# Patient Record
Sex: Male | Born: 1985 | Race: White | Hispanic: No | Marital: Married | Smoking: Never smoker
Health system: Southern US, Community
[De-identification: ages and names within clinical notes are randomized; demographics above are authoritative.]

## PROBLEM LIST (undated history)

## (undated) DIAGNOSIS — K8020 Calculus of gallbladder without cholecystitis without obstruction: Secondary | ICD-10-CM

## (undated) HISTORY — PX: TONSILLECTOMY: SUR1361

## (undated) DEATH — deceased

---

## 2012-06-15 ENCOUNTER — Encounter (HOSPITAL_BASED_OUTPATIENT_CLINIC_OR_DEPARTMENT_OTHER): Payer: Self-pay | Admitting: *Deleted

## 2012-06-15 ENCOUNTER — Emergency Department (HOSPITAL_BASED_OUTPATIENT_CLINIC_OR_DEPARTMENT_OTHER)
Admission: EM | Admit: 2012-06-15 | Discharge: 2012-06-15 | Disposition: A | Discharge status: Home / Self Care | Attending: Emergency Medicine | Admitting: Emergency Medicine

## 2012-06-15 DIAGNOSIS — Y9389 Activity, other specified: Secondary | ICD-10-CM | POA: Insufficient documentation

## 2012-06-15 DIAGNOSIS — S61412A Laceration without foreign body of left hand, initial encounter: Secondary | ICD-10-CM

## 2012-06-15 DIAGNOSIS — S61409A Unspecified open wound of unspecified hand, initial encounter: Secondary | ICD-10-CM | POA: Insufficient documentation

## 2012-06-15 DIAGNOSIS — W230XXA Caught, crushed, jammed, or pinched between moving objects, initial encounter: Secondary | ICD-10-CM | POA: Insufficient documentation

## 2012-06-15 DIAGNOSIS — Y929 Unspecified place or not applicable: Secondary | ICD-10-CM | POA: Insufficient documentation

## 2012-06-15 NOTE — ED Provider Notes (Signed)
History     CSN: [id number]  Arrival date & time 06/15/12  1356   First MD Initiated Contact with Patient 06/15/12 1521      Chief Complaint  Patient presents with  . Extremity Laceration    (Consider location/radiation/quality/duration/timing/severity/associated sxs/prior treatment) Patient is a 27 y.o. male presenting with skin laceration. The history is provided by the patient. No language interpreter was used.  Laceration Location:  Hand Hand laceration location:  L hand Length (cm):  3 Depth:  Through dermis Quality: stellate   Bleeding: controlled   Time since incident:  1 hour Laceration mechanism:  Metal edge (smashed hand between door and dumpter while throwing door in dumpster (pt is in process of moving)) Pain details:    Quality:  Sharp   Severity:  Mild   Timing:  Constant   Progression:  Unchanged Foreign body present:  No foreign bodies Relieved by:  None tried Worsened by:  Movement Tetanus status:  Up to date   History reviewed. No pertinent past medical history.  Past Surgical History  Procedure Laterality Date  . Tonsillectomy      No family history on file.  History  Substance Use Topics  . Smoking status: Never Smoker   . Smokeless tobacco: Not on file  . Alcohol Use: Yes      Review of Systems  Musculoskeletal: Positive for arthralgias. Negative for myalgias.  Skin: Positive for wound. Negative for color change.    Allergies  Review of patient's allergies indicates no known allergies.  Home Medications   Current Outpatient Rx  Name  Route  Sig  Dispense  Refill  . Loratadine (CLARITIN PO)   Oral   Take by mouth.           BP 153/75  Pulse 70  Temp(Src) 98.6 F (37 C) (Oral)  Resp 18  SpO2 96%  Physical Exam  Nursing note and vitals reviewed. Constitutional: He appears well-developed and well-nourished. No distress.  HENT:  Head: Normocephalic and atraumatic.  Eyes: Conjunctivae are normal. No scleral icterus.    Neck: Normal range of motion.  Cardiovascular: Normal rate, regular rhythm and normal heart sounds.   Pulmonary/Chest: Effort normal and breath sounds normal.  Musculoskeletal: Normal range of motion. He exhibits tenderness. He exhibits no edema.       Left hand: He exhibits tenderness and laceration. He exhibits normal range of motion, normal capillary refill, no deformity and no swelling. Normal sensation noted. Normal strength noted.       Hands: 3cm triangular lac to 2nd MPC joint of left hand. No foreign bodies. CMS in tact. No tendon involvement.   Neurological: He is alert.  Skin: Skin is warm and dry. He is not diaphoretic.  Psychiatric: He has a normal mood and affect. His behavior is normal.    ED Course  LACERATION REPAIR Date/Time: 06/16/2012 12:54 PM Performed by: Junius Finner Authorized by: Junius Finner Consent: Verbal consent obtained. Risks and benefits: risks, benefits and alternatives were discussed Consent given by: patient Patient understanding: patient states understanding of the procedure being performed Patient identity confirmed: verbally with patient Body area: upper extremity Location details: left hand Laceration length: 3 cm Foreign bodies: no foreign bodies Tendon involvement: none Nerve involvement: none Vascular damage: no Anesthesia: local infiltration Local anesthetic: lidocaine 2% without epinephrine Anesthetic total: 4 ml Patient sedated: no Preparation: Patient was prepped and draped in the usual sterile fashion. Irrigation solution: tap water Irrigation method: tap Amount of cleaning: standard Debridement:  none Degree of undermining: none Skin closure: 4-0 Prolene Number of sutures: 3 Technique: simple Approximation: close Approximation difficulty: simple Dressing: 4x4 sterile gauze Patient tolerance: Patient tolerated the procedure well with no immediate complications.   (including critical care time)  Labs Reviewed - No data  to display No results found.   1. Laceration of left hand, initial encounter       MDM  Pt cut left hand near 2nd MCP joint while throwing door into dumpster.  Pt states he is in the process of moving.  Tetanus status UTD.  3cm lac on left hand, bleeding controlled, CMS in tact, no foreign bodies.   3 interrrupted sutures, 4-0 prolene, see procedure note  OTC: Tylenol or ibuprofen for pain.  F/u with family physician's office, urgent care, return to ED if needed for suture removal in 7-10 days. Provided pt education packet on sutured wound care. Vitals: unremarkable. Discharged in stable condition.            Junius Finner, PA-C 06/16/12 1259

## 2012-06-15 NOTE — ED Notes (Signed)
Left hand laceration. Bleeding controlled. 1" laceration over his left 2nd knuckle.

## 2012-06-16 NOTE — ED Provider Notes (Signed)
Medical screening examination/treatment/procedure(s) were performed by non-physician practitioner and as supervising physician I was immediately available for consultation/collaboration.   Jehu Mccauslin B. Bernette Mayers, MD 06/16/12 1753

## 2012-07-21 ENCOUNTER — Encounter (HOSPITAL_BASED_OUTPATIENT_CLINIC_OR_DEPARTMENT_OTHER): Payer: Self-pay | Admitting: *Deleted

## 2012-07-21 ENCOUNTER — Emergency Department (HOSPITAL_BASED_OUTPATIENT_CLINIC_OR_DEPARTMENT_OTHER)

## 2012-07-21 ENCOUNTER — Emergency Department (HOSPITAL_BASED_OUTPATIENT_CLINIC_OR_DEPARTMENT_OTHER)
Admission: EM | Admit: 2012-07-21 | Discharge: 2012-07-21 | Disposition: A | Discharge status: Home / Self Care | Attending: Emergency Medicine | Admitting: Emergency Medicine

## 2012-07-21 DIAGNOSIS — M545 Low back pain, unspecified: Secondary | ICD-10-CM | POA: Insufficient documentation

## 2012-07-21 DIAGNOSIS — R109 Unspecified abdominal pain: Secondary | ICD-10-CM | POA: Insufficient documentation

## 2012-07-21 DIAGNOSIS — N509 Disorder of male genital organs, unspecified: Secondary | ICD-10-CM | POA: Insufficient documentation

## 2012-07-21 LAB — URINALYSIS, ROUTINE W REFLEX MICROSCOPIC
Hgb urine dipstick: NEGATIVE
Leukocytes, UA: NEGATIVE
Nitrite: NEGATIVE
Protein, ur: NEGATIVE mg/dL
Urobilinogen, UA: 0.2 mg/dL (ref 0.0–1.0)

## 2012-07-21 LAB — CBC WITH DIFFERENTIAL/PLATELET
Basophils Absolute: 0 10*3/uL (ref 0.0–0.1)
Eosinophils Relative: 11 % — ABNORMAL HIGH (ref 0–5)
Lymphocytes Relative: 34 % (ref 12–46)
MCV: 86.4 fL (ref 78.0–100.0)
Platelets: 175 10*3/uL (ref 150–400)
RDW: 12.3 % (ref 11.5–15.5)
WBC: 7 10*3/uL (ref 4.0–10.5)

## 2012-07-21 LAB — COMPREHENSIVE METABOLIC PANEL
ALT: 17 U/L (ref 0–53)
AST: 22 U/L (ref 0–37)
CO2: 29 mEq/L (ref 19–32)
Calcium: 10.1 mg/dL (ref 8.4–10.5)
GFR calc non Af Amer: >90 mL/min (ref >90)
Sodium: 141 mEq/L (ref 135–145)
Total Protein: 7.5 g/dL (ref 6.0–8.3)

## 2012-07-21 MED ORDER — ONDANSETRON HCL 4 MG PO TABS: 4.0000 mg | ORAL_TABLET | Freq: Four times a day (QID) | ORAL | Status: AC

## 2012-07-21 MED ORDER — IOHEXOL 300 MG/ML  SOLN
50.0000 mL | Freq: Once | INTRAMUSCULAR | Status: AC | PRN
  Administered 2012-07-21: 50 mL via ORAL

## 2012-07-21 MED ORDER — HYDROCODONE-ACETAMINOPHEN 5-325 MG PO TABS: 2.0000 | ORAL_TABLET | ORAL | Status: DC | PRN

## 2012-07-21 MED ORDER — IOHEXOL 300 MG/ML  SOLN
100.0000 mL | Freq: Once | INTRAMUSCULAR | Status: AC | PRN
  Administered 2012-07-21: 100 mL via INTRAVENOUS

## 2012-07-21 NOTE — ED Provider Notes (Signed)
This chart was scribed for Omar Octave, MD, by Candelaria Stagers, ED Scribe. This patient was seen in room MH07/[facility]07 and the patient's care was started at 3:22 PM   History    CSN: [id number] Arrival date & time 07/21/12  1334  First MD Initiated Contact with Patient 07/21/12 1502     Chief Complaint  Patient presents with  . Groin Pain    The history is provided by the patient. No language interpreter was used.   HPI Comments: Omar Foley is a 27 y.o. male who presents to the Emergency Department complaining of constant left lower back pain that started about three days ago radiating to his left groin.  He is now experiencing testicular pain that started yesterday with no change.  He reports increased urination.  He denies hematuria, penile discharge, dysuria, fevers, or vomiting.  He reports eating and drinking normally.  He denies recent injury or trauma to his lower back or testicles.  Nothing seems to make the sx better or worse.  He has no h/o kidney stones or hernia.      History reviewed. No pertinent past medical history. Past Surgical History  Procedure Laterality Date  . Tonsillectomy     History reviewed. No pertinent family history. History  Substance Use Topics  . Smoking status: Never Smoker   . Smokeless tobacco: Not on file  . Alcohol Use: Yes    Review of Systems  All other systems reviewed and are negative.    Allergies  Review of patient's allergies indicates no known allergies.  Home Medications   Current Outpatient Rx  Name  Route  Sig  Dispense  Refill  . Loratadine (CLARITIN PO)   Oral   Take by mouth.          BP 140/80  Pulse 79  Temp(Src) 97.6 F (36.4 C) (Oral)  Resp 16  Ht 5\' 10"  (1.778 m)  Wt 200 lb (90.719 kg)  BMI 28.7 kg/m2  SpO2 100% Physical Exam  Nursing note and vitals reviewed. Constitutional: He is oriented to person, place, and time. He appears well-developed and well-nourished.  HENT:  Head: Normocephalic and  atraumatic.  Right Ear: External ear normal.  Left Ear: External ear normal.  Nose: Nose normal.  Mouth/Throat: Oropharynx is clear and moist.  Eyes: Conjunctivae and EOM are normal. Pupils are equal, round, and reactive to light.  Neck: Normal range of motion. Neck supple.  Cardiovascular: Normal rate, regular rhythm, normal heart sounds and intact distal pulses.   No murmur heard. Pulmonary/Chest: Effort normal and breath sounds normal.  Abdominal: Soft. Bowel sounds are normal.  Mild suprapubic tenderness.  Genitourinary: No penile tenderness.  No testicular tenderness.  No appreciable hernias  Musculoskeletal: Normal range of motion.  No CVA tenderness.  5/5 strength in bilateral lower extremities. Ankle plantar and dorsiflexion intact. Great toe extension intact bilaterally. +2 DP and PT pulses. +2 patellar reflexes bilaterally. Normal gait.    Neurological: He is alert and oriented to person, place, and time. He has normal reflexes.  Skin: Skin is warm and dry.  Psychiatric: He has a normal mood and affect. His behavior is normal. Thought content normal.    ED Course  Procedures   DIAGNOSTIC STUDIES: Oxygen Saturation is 100% on room air, normal by my interpretation.    COORDINATION OF CARE:  3:27 PM Discussed course of care with pt which includes blood work.  Pt denies pain medication.  Pt understands and agrees.   5:55 PM  Discussed negative results with pt.  Will discharge with pain medication.  Will provide referral for PCP.   Labs Reviewed  CBC WITH DIFFERENTIAL - Abnormal; Notable for the following:    Eosinophils Relative 11 (*)    Eosinophils Absolute 0.8 (*)    All other components within normal limits  URINALYSIS, ROUTINE W REFLEX MICROSCOPIC  COMPREHENSIVE METABOLIC PANEL  LIPASE, BLOOD   US Scrotum  07/21/2012   *RADIOLOGY REPORT*  Clinical Data:  Back pain radiating to bilateral groin  SCROTAL ULTRASOUND DOPPLER ULTRASOUND OF THE TESTICLES  Technique:  Complete ultrasound examination of the testicles, epididymis, and other scrotal structures was performed.  Color and spectral Doppler ultrasound were also utilized to evaluate blood flow to the testicles.  Comparison:  None.  Findings:  Right testis:  Normal in size and appearance, measuring 3.9 x 2.2 x 2.9 cm.  Left testis:  Normal in size and appearance, measuring 3.9 x 2.2 x 2.8 cm.  Right epididymis:  Normal in size and appearance, noting a 4 mm epididymal head cyst.  Left epididymis:  Normal in size and appearance.  Hydrocele:  Absent.  Varicocele:  Absent.  Pulsed Doppler interrogation of both testes demonstrates low resistance flow bilaterally.  IMPRESSION: Negative scrotal ultrasound.  No evidence of testicular torsion.   Original Report Authenticated By: Charline Bills, M.D.   Ct Abdomen Pelvis W Contrast  07/21/2012   *RADIOLOGY REPORT*  Clinical Data: Right groin and lower back pain.  CT ABDOMEN AND PELVIS WITH CONTRAST  Technique:  Multidetector CT imaging of the abdomen and pelvis was performed following the standard protocol during bolus administration of intravenous contrast.  Contrast: 50mL OMNIPAQUE IOHEXOL 300 MG/ML  SOLN, OMNIPAQUE IOHEXOL 300 MG/ML  SOLN  Comparison: None.  Findings: Mild diffuse low density of the liver relative to the spleen.  Normal appearing spleen, pancreas, adrenal glands, kidneys, urinary bladder and prostate gland.  Probable noncalcified gallstones in the gallbladder without gallbladder wall thickening or pericholecystic fluid.  No gastrointestinal abnormalities or enlarged lymph nodes.  No evidence of appendicitis.  The appendix is well visualized and has a normal appearance.  Clear lung bases.  Unremarkable bones.  IMPRESSION:  1.  Probable cholelithiasis. 2.  Mild diffuse hepatic steatosis.   Original Report Authenticated By: Beckie Salts, M.D.   [place] Art/ven Flow Abd Pelv Doppler  07/21/2012   *RADIOLOGY REPORT*  Clinical Data:  Back pain radiating to  bilateral groin  SCROTAL ULTRASOUND DOPPLER ULTRASOUND OF THE TESTICLES  Technique: Complete ultrasound examination of the testicles, epididymis, and other scrotal structures was performed.  Color and spectral Doppler ultrasound were also utilized to evaluate blood flow to the testicles.  Comparison:  None.  Findings:  Right testis:  Normal in size and appearance, measuring 3.9 x 2.2 x 2.9 cm.  Left testis:  Normal in size and appearance, measuring 3.9 x 2.2 x 2.8 cm.  Right epididymis:  Normal in size and appearance, noting a 4 mm epididymal head cyst.  Left epididymis:  Normal in size and appearance.  Hydrocele:  Absent.  Varicocele:  Absent.  Pulsed Doppler interrogation of both testes demonstrates low resistance flow bilaterally.  IMPRESSION: Negative scrotal ultrasound.  No evidence of testicular torsion.   Original Report Authenticated By: Charline Bills, M.D.   No diagnosis found.  MDM  L flank pain 4 days ago, now resolved, now with suprapubic pressure and testicular tenderness. No vomiting. No dysuria or hematuria.  Urinalysis negative. Patient appears comfortable. Labs unremarkable. Scrotal ultrasound  normal. No evidence of torsion. CT scan shows cholelithiasis without other pathology. Normal appendix. Patient with no right upper quadrant pain. Normal LFTs and lipase.  Consider recently passed kidney stone versus muscular skeletal pain. Stable for outpatient followup.   Date: 07/21/2012  Rate: 73  Rhythm: normal sinus rhythm  QRS Axis: normal  Intervals: normal  ST/T Wave abnormalities: normal  Conduction Disutrbances:none  Narrative Interpretation:   Old EKG Reviewed: none available   I personally performed the services described in this documentation, which was scribed in my presence. The recorded information has been reviewed and is accurate.    Omar Octave, MD 07/21/12 [phone]

## 2012-07-21 NOTE — ED Notes (Signed)
Pt c/o groin pain and lower back pain x 3 days

## 2012-07-21 NOTE — ED Notes (Signed)
Patient transported to CT 

## 2013-12-12 ENCOUNTER — Emergency Department (HOSPITAL_BASED_OUTPATIENT_CLINIC_OR_DEPARTMENT_OTHER)
Admission: EM | Admit: 2013-12-12 | Discharge: 2013-12-12 | Disposition: A | Discharge status: Home / Self Care | Payer: No Typology Code available for payment source | Attending: Emergency Medicine | Admitting: Emergency Medicine

## 2013-12-12 ENCOUNTER — Encounter (HOSPITAL_BASED_OUTPATIENT_CLINIC_OR_DEPARTMENT_OTHER): Payer: Self-pay | Admitting: *Deleted

## 2013-12-12 DIAGNOSIS — M545 Low back pain, unspecified: Secondary | ICD-10-CM

## 2013-12-12 DIAGNOSIS — K30 Functional dyspepsia: Secondary | ICD-10-CM | POA: Diagnosis not present

## 2013-12-12 DIAGNOSIS — Z79899 Other long term (current) drug therapy: Secondary | ICD-10-CM | POA: Insufficient documentation

## 2013-12-12 DIAGNOSIS — Z8719 Personal history of other diseases of the digestive system: Secondary | ICD-10-CM | POA: Diagnosis not present

## 2013-12-12 HISTORY — DX: Calculus of gallbladder without cholecystitis without obstruction: K80.20

## 2013-12-12 LAB — BASIC METABOLIC PANEL
ANION GAP: 13 (ref 5–15)
BUN: 16 mg/dL (ref 6–23)
CHLORIDE: 104 meq/L (ref 96–112)
CO2: 26 meq/L (ref 19–32)
CREATININE: 1.1 mg/dL (ref 0.50–1.35)
Calcium: 9.8 mg/dL (ref 8.4–10.5)
GFR calc non Af Amer: >90 mL/min (ref >90)
Glucose, Bld: 106 mg/dL — ABNORMAL HIGH (ref 70–99)
POTASSIUM: 4.2 meq/L (ref 3.7–5.3)
SODIUM: 143 meq/L (ref 137–147)

## 2013-12-12 LAB — CBC WITH DIFFERENTIAL/PLATELET
BASOS ABS: 0.1 10*3/uL (ref 0.0–0.1)
BASOS PCT: 1 % (ref 0–1)
Eosinophils Absolute: 0.8 10*3/uL — ABNORMAL HIGH (ref 0.0–0.7)
Eosinophils Relative: 8 % — ABNORMAL HIGH (ref 0–5)
HCT: 44.8 % (ref 39.0–52.0)
Hemoglobin: 15.6 g/dL (ref 13.0–17.0)
LYMPHS PCT: 37 % (ref 12–46)
Lymphs Abs: 3.5 10*3/uL (ref 0.7–4.0)
MCH: 30.5 pg (ref 26.0–34.0)
MCHC: 34.8 g/dL (ref 30.0–36.0)
MCV: 87.7 fL (ref 78.0–100.0)
Monocytes Absolute: 0.7 10*3/uL (ref 0.1–1.0)
Monocytes Relative: 7 % (ref 3–12)
NEUTROS ABS: 4.4 10*3/uL (ref 1.7–7.7)
NEUTROS PCT: 47 % (ref 43–77)
PLATELETS: 232 10*3/uL (ref 150–400)
RBC: 5.11 MIL/uL (ref 4.22–5.81)
RDW: 12.5 % (ref 11.5–15.5)
WBC: 9.4 10*3/uL (ref 4.0–10.5)

## 2013-12-12 LAB — URINALYSIS, ROUTINE W REFLEX MICROSCOPIC
BILIRUBIN URINE: NEGATIVE
Glucose, UA: NEGATIVE mg/dL
Hgb urine dipstick: NEGATIVE
Ketones, ur: 15 mg/dL — AB
LEUKOCYTES UA: NEGATIVE
NITRITE: NEGATIVE
Protein, ur: NEGATIVE mg/dL
SPECIFIC GRAVITY, URINE: 1.017 (ref 1.005–1.030)
UROBILINOGEN UA: 0.2 mg/dL (ref 0.0–1.0)
pH: 6.5 (ref 5.0–8.0)

## 2013-12-12 MED ORDER — RANITIDINE HCL 150 MG PO CAPS: 150.0000 mg | ORAL_CAPSULE | Freq: Every day | ORAL | Status: AC

## 2013-12-12 MED ORDER — HYDROCODONE-ACETAMINOPHEN 5-325 MG PO TABS: 2.0000 | ORAL_TABLET | ORAL | Status: AC | PRN

## 2013-12-12 MED ORDER — OMEPRAZOLE 20 MG PO CPDR: 20.0000 mg | DELAYED_RELEASE_CAPSULE | Freq: Every day | ORAL | Status: AC

## 2013-12-12 MED ORDER — METHOCARBAMOL 500 MG PO TABS
500.0000 mg | ORAL_TABLET | Freq: Two times a day (BID) | ORAL | Status: AC
Start: 2013-12-12 — End: ?

## 2013-12-12 NOTE — ED Provider Notes (Signed)
CSN: [id number]     Arrival date & time 12/12/13  1937 History   First MD Initiated Contact with Patient 12/12/13 2025     Chief Complaint  Patient presents with  . Back Pain     (Consider location/radiation/quality/duration/timing/severity/associated sxs/prior Treatment) HPI   28 year old male with history of gallstones who presents complaining of low back pain. Patient reports for the past 3 weeks he has been burping more than usual. For the past 2 weeks he has had persistent low back pain in which she described as a sharp and aching sensation, worsening with positional change and today the pain radiates down to his left groin. Pain is minimally improved with over-the-counter medication and with heating pad. He denies any specific injury or any strenuous activities. He denies any fever, chills, headache, chest pain, shortness of breath, productive cough, abdominal pain, dysuria, hematuria, penile discharge, testicular tenderness, scrotal swelling, or rash. He is a nonsmoker. He denies any discomfort with eating. He has had similar episode last year which he was evaluated in the ER but no specific finding were noted except for evidence of gallstone.  Past Medical History  Diagnosis Date  . Gallstones    Past Surgical History  Procedure Laterality Date  . Tonsillectomy     No family history on file. History  Substance Use Topics  . Smoking status: Never Smoker   . Smokeless tobacco: Not on file  . Alcohol Use: Yes    Review of Systems  All other systems reviewed and are negative.     Allergies  Review of patient's allergies indicates no known allergies.  Home Medications   Prior to Admission medications   Medication Sig Start Date End Date Taking? Authorizing Provider  Loratadine (CLARITIN PO) Take by mouth.   Yes Historical Provider, MD  HYDROcodone-acetaminophen (NORCO/VICODIN) 5-325 MG per tablet Take 2 tablets by mouth every 4 (four) hours as needed for pain. 07/21/12    Glynn OctaveStephen Rancour, MD  ondansetron (ZOFRAN) 4 MG tablet Take 1 tablet (4 mg total) by mouth every 6 (six) hours. 07/21/12   Glynn OctaveStephen Rancour, MD   BP 156/101 mmHg  Pulse 85  Temp(Src) 97.9 F (36.6 C) (Oral)  Resp 18  Ht 5\' 10"  (1.778 m)  Wt 220 lb (99.791 kg)  BMI 31.57 kg/m2  SpO2 95% Physical Exam  Constitutional: He appears well-developed and well-nourished. No distress.  HENT:  Head: Atraumatic.  Eyes: Conjunctivae are normal.  Neck: Normal range of motion. Neck supple.  Cardiovascular: Normal rate and regular rhythm.   Pulmonary/Chest: Effort normal and breath sounds normal.  Abdominal: Soft. There is no tenderness.  No inguinal hernia noted  Genitourinary:  Mild left CVA tenderness to percussion  Penis with normal shaft, the size, nontender to palpation, no testicular tenderness or scrotal swelling. Testicle with normal lies  Musculoskeletal:  No significant midline spine tenderness crepitus step-off  Neurological: He is alert.  Patellar deep tendon reflex 2+ bilaterally, no foot drop. 55 trace to all 4 extremities with intact distal pulse and normal sensation.  Skin: No rash noted.  Psychiatric: He has a normal mood and affect.    ED Course  Procedures (including critical care time)  9:10 PM Patient here with persistent low back pain. Pain is reproducible on exam. Patient is afebrile with stable normal vital sign. Workup initiated. Pain medication offered, patient declined.  10:27 PM Labs are unremarkable, UA without evidence of urinary tract infection. Suspect musculoskeletal. Patient is afebrile with stable normal vital sign. Patient  is mildly hypertensive but does not have any significant cardiac history. Patient report having recurrent indigestion and has been burping frequently for the past several weeks. I will provide GI specialist referral, and we'll prescribe a PPI and H2 blocker for some hepatic treatment. At this time he has no evidence to suggest active  gallbladder etiology. Strict return precautions discussed.  Labs Review Labs Reviewed  CBC WITH DIFFERENTIAL - Abnormal; Notable for the following:    Eosinophils Relative 8 (*)    Eosinophils Absolute 0.8 (*)    All other components within normal limits  BASIC METABOLIC PANEL - Abnormal; Notable for the following:    Glucose, Bld 106 (*)    All other components within normal limits  URINALYSIS, ROUTINE W REFLEX MICROSCOPIC - Abnormal; Notable for the following:    Ketones, ur 15 (*)    All other components within normal limits    Imaging Review No results found.   EKG Interpretation None      MDM   Final diagnoses:  Left-sided low back pain without sciatica  Indigestion    BP 156/101 mmHg  Pulse 85  Temp(Src) 97.9 F (36.6 C) (Oral)  Resp 18  Ht 5\' 10"  (1.778 m)  Wt 220 lb (99.791 kg)  BMI 31.57 kg/m2  SpO2 95%     Fayrene HelperBowie Demarion Pondexter, PA-C 12/12/13 2228  Vanetta MuldersScott Zackowski, MD 12/13/13 2007

## 2013-12-12 NOTE — Discharge Instructions (Signed)
Back Pain, Adult Low back pain is very common. About 1 in 5 people have back pain.The cause of low back pain is rarely dangerous. The pain often gets better over time.About half of people with a sudden onset of back pain feel better in just 2 weeks. About 8 in 10 people feel better by 6 weeks.  CAUSES Some common causes of back pain include:  Strain of the muscles or ligaments supporting the spine.  Wear and tear (degeneration) of the spinal discs.  Arthritis.  Direct injury to the back. DIAGNOSIS Most of the time, the direct cause of low back pain is not known.However, back pain can be treated effectively even when the exact cause of the pain is unknown.Answering your caregiver's questions about your overall health and symptoms is one of the most accurate ways to make sure the cause of your pain is not dangerous. If your caregiver needs more information, he or she may order lab work or imaging tests (X-rays or MRIs).However, even if imaging tests show changes in your back, this usually does not require surgery. HOME CARE INSTRUCTIONS For many people, back pain returns.Since low back pain is rarely dangerous, it is often a condition that people can learn to [facility] their own.   Remain active. It is stressful on the back to sit or stand in one place. Do not sit, drive, or stand in one place for more than 30 minutes at a time. Take short walks on level surfaces as soon as pain allows.Try to increase the length of time you walk each day.  Do not stay in bed.Resting more than 1 or 2 days can delay your recovery.  Do not avoid exercise or work.Your body is made to move.It is not dangerous to be active, even though your back may hurt.Your back will likely heal faster if you return to being active before your pain is gone.  Pay attention to your body when you bend and lift. Many people have less discomfortwhen lifting if they bend their knees, keep the load close to their bodies,and  avoid twisting. Often, the most comfortable positions are those that put less stress on your recovering back.  Find a comfortable position to sleep. Use a firm mattress and lie on your side with your knees slightly bent. If you lie on your back, put a pillow under your knees.  Only take over-the-counter or prescription medicines as directed by your caregiver. Over-the-counter medicines to reduce pain and inflammation are often the most helpful.Your caregiver may prescribe muscle relaxant drugs.These medicines help dull your pain so you can more quickly return to your normal activities and healthy exercise.  Put ice on the injured area.  Put ice in a plastic bag.  Place a towel between your skin and the bag.  Leave the ice on for 15-20 minutes, 03-04 times a day for the first 2 to 3 days. After that, ice and heat may be alternated to reduce pain and spasms.  Ask your caregiver about trying back exercises and gentle massage. This may be of some benefit.  Avoid feeling anxious or stressed.Stress increases muscle tension and can worsen back pain.It is important to recognize when you are anxious or stressed and learn ways to manage it.Exercise is a great option. SEEK MEDICAL CARE IF:  You have pain that is not relieved with rest or medicine.  You have pain that does not improve in 1 week.  You have new symptoms.  You are generally not feeling well. SEEK  IMMEDIATE MEDICAL CARE IF:   You have pain that radiates from your back into your legs.  You develop new bowel or bladder control problems.  You have unusual weakness or numbness in your arms or legs.  You develop nausea or vomiting.  You develop abdominal pain.  You feel faint. Document Released: 01/07/2005 Document Revised: 07/09/2011 Document Reviewed: 05/11/2013 [facility] Patient Information 2015 [place], [place]. This information is not intended to replace advice given to you by your health care provider. Make sure you  discuss any questions you have with your health care provider.  Indigestion  Indigestion is discomfort in the upper belly (abdomen). HOME CARE  Avoid foods and drinks that make your problems worse. You may want to avoid:  Caffeine and alcohol.  Chocolate.  Peppermint.  Garlic and onions.  Spicy foods.  Citrus fruits, such as oranges, lemons, or limes.  Tomato-based foods such as sauce, chili, salsa, and pizza.  Fried and fatty foods.  Avoid eating for 3 hours before your bedtime.  Eat small meals instead of large meals more often.  Stop smoking if you smoke.  Maintain a healthy weight.  Wear loose-fitting clothing. Do not wear anything tight around your waist.  Raise the head of your bed 4 to 8 inches with wood blocks.  Only take medicines as told by your doctor.  Do not take aspirin or ibuprofen. GET HELP RIGHT AWAY IF:  You are not better after 2 days.  You have chest pain that goes into your neck, arms, back, jaw, or upper belly.  You have trouble swallowing.  You keep throwing up (vomiting).  You have black or bloody poop (stool).  You have a fever.  You have trouble breathing, you feel dizzy, or you pass out (faint).  You are sweating a lot.  You have severe belly pain.  You lose weight without trying. MAKE SURE YOU:  Understand these instructions.  Will watch your condition.  Will get help right away if you are not doing well or get worse. Document Released: 02/09/2010 Document Revised: 04/01/2011 Document Reviewed: 08/22/2010 [facility] CenterExitCare Patient Information 2015 [place], [place]. This information is not intended to replace advice given to you by your health care provider. Make sure you discuss any questions you have with your health care provider.

## 2013-12-12 NOTE — ED Notes (Signed)
Pt reports two weeks of lower back pain that now radiates down through his left groin area today.

## 2014-10-07 IMAGING — US US SCROTUM
1 series · 14 of 25 positions shown · non-contrast
Comparison: None.

CLINICAL DATA: Back pain radiating to bilateral groin

SCROTAL ULTRASOUND
DOPPLER ULTRASOUND OF THE TESTICLES
TECHNIQUE: Complete ultrasound examination of the testicles,
epididymis, and other scrotal structures was performed.  Color and
spectral Doppler ultrasound were also utilized to evaluate blood
flow to the testicles.

[Series 1: us scrotum · 0.08mm/px · 14 of 56 slices shown]
[im 1/56]
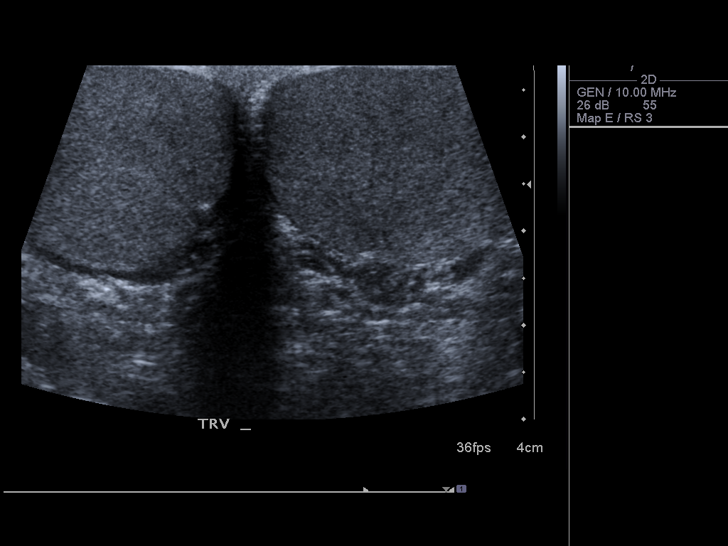
[im 5/56]
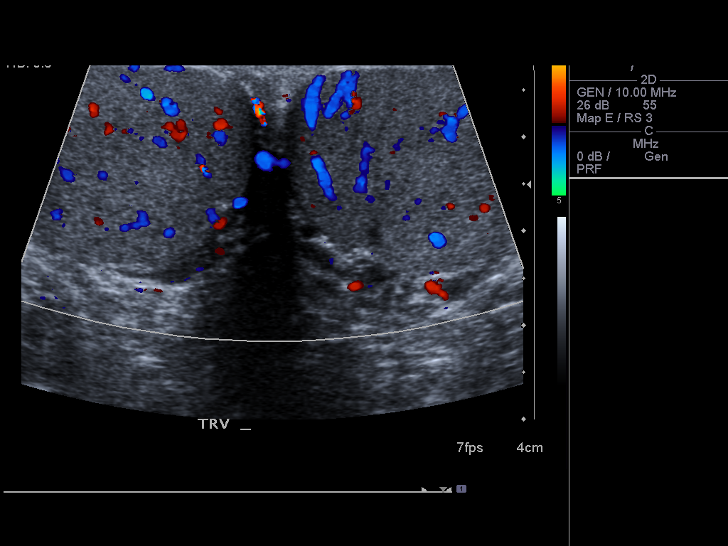
[im 10/56]
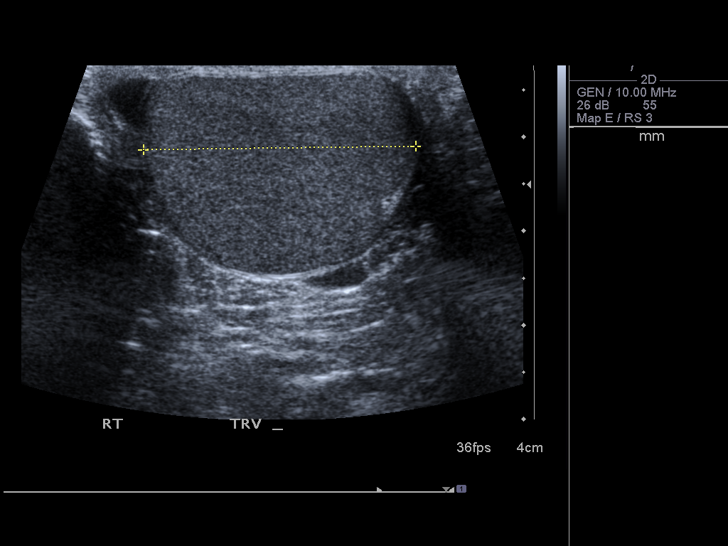
[im 14/56]
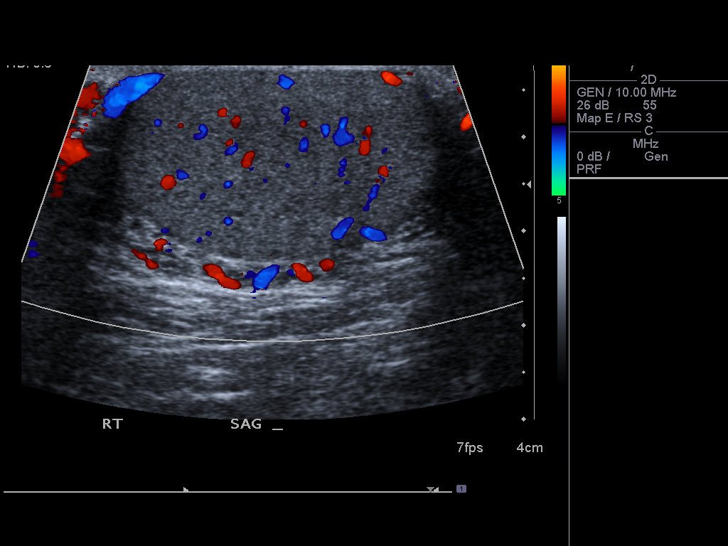
[im 19/56]
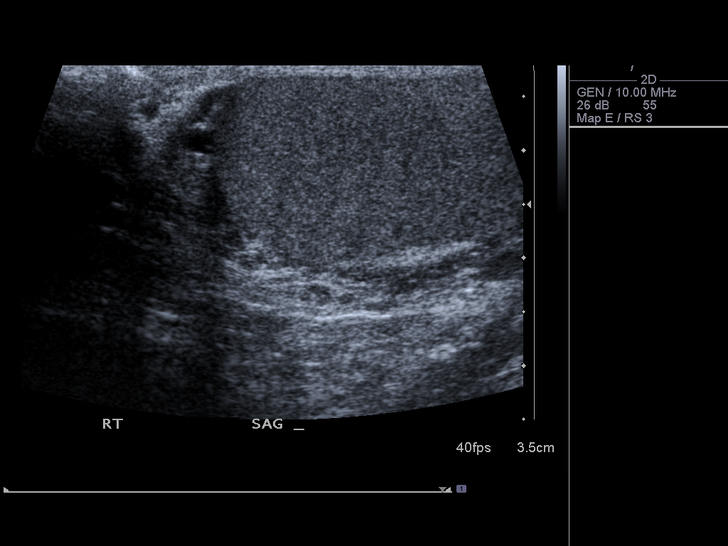
[im 21/56]
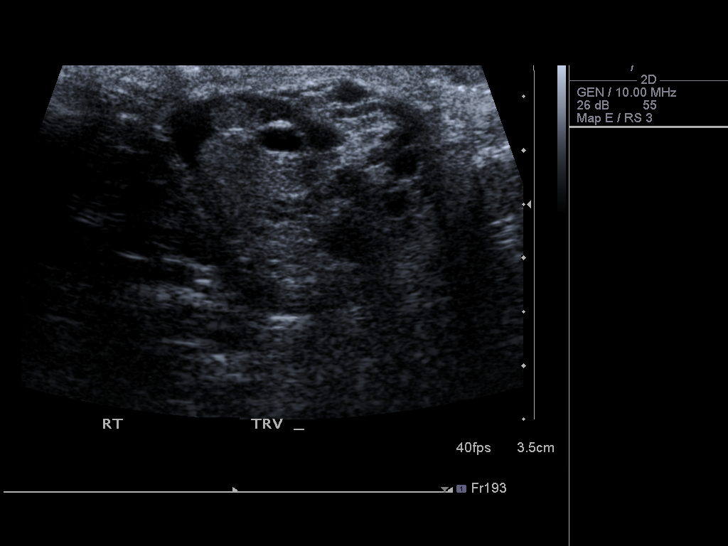
[im 26/56]
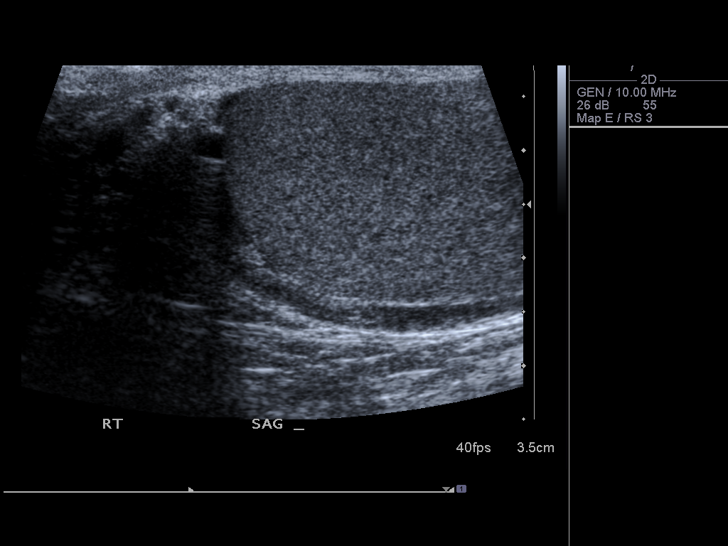
[im 30/56]
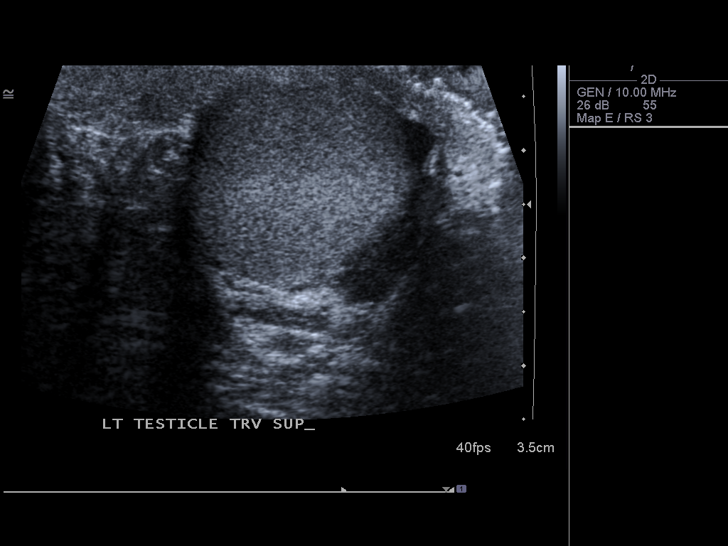
[im 35/56]
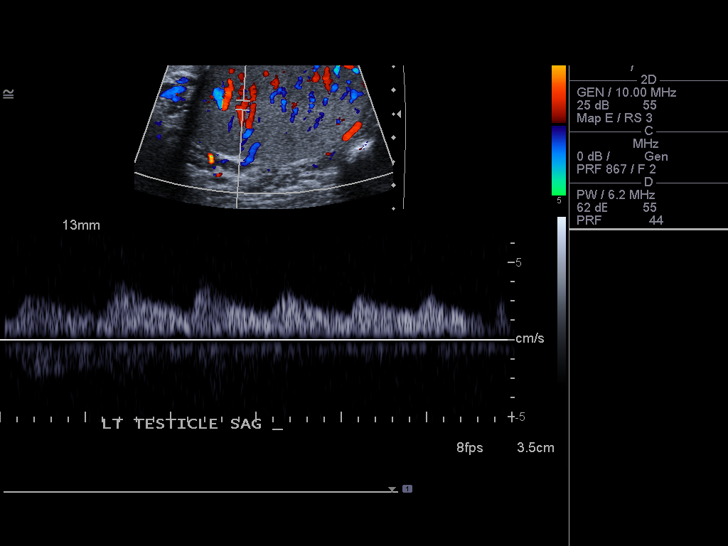
[im 37/56]
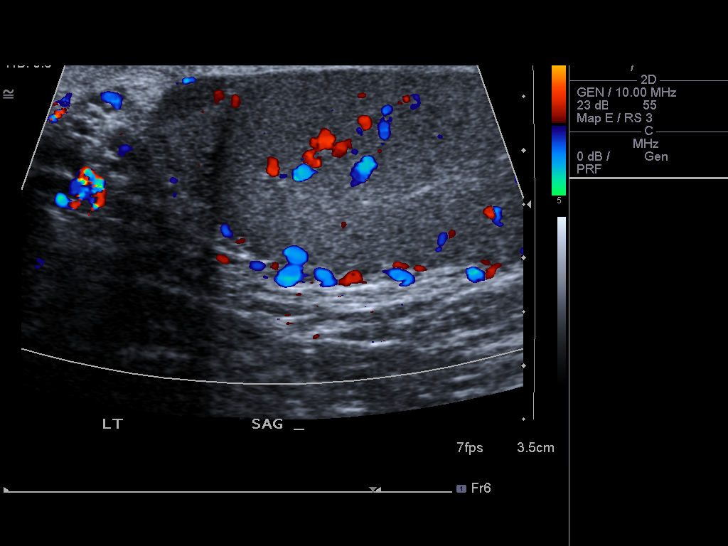
[im 42/56]
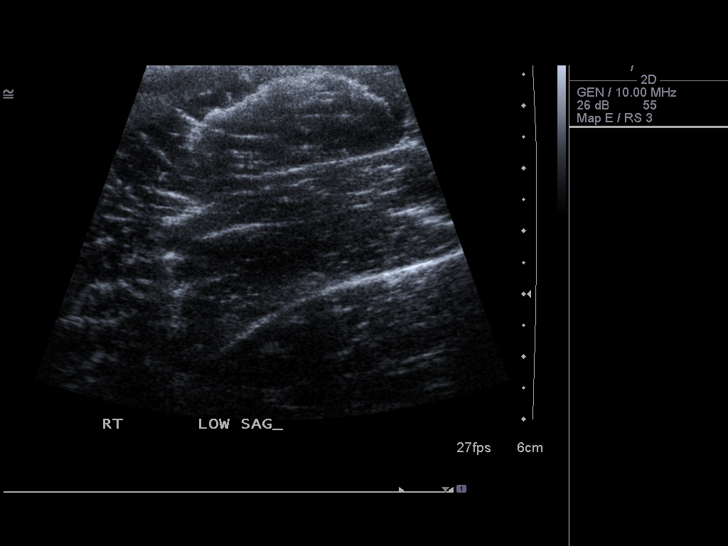
[im 46/56]
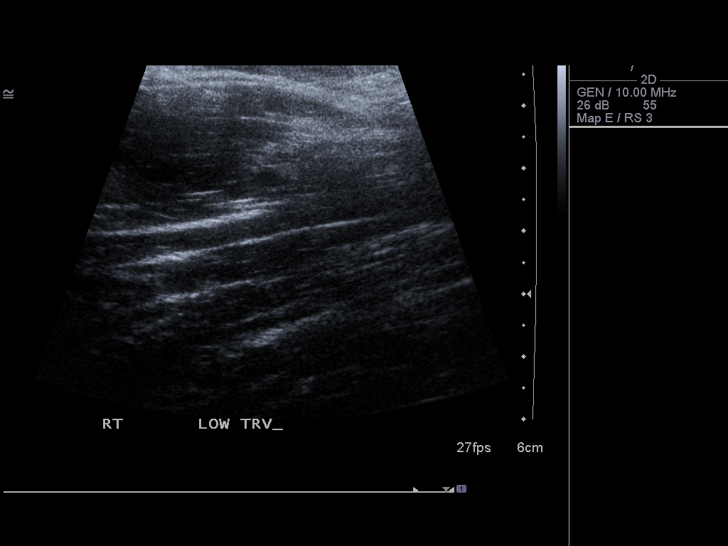
[im 51/56]
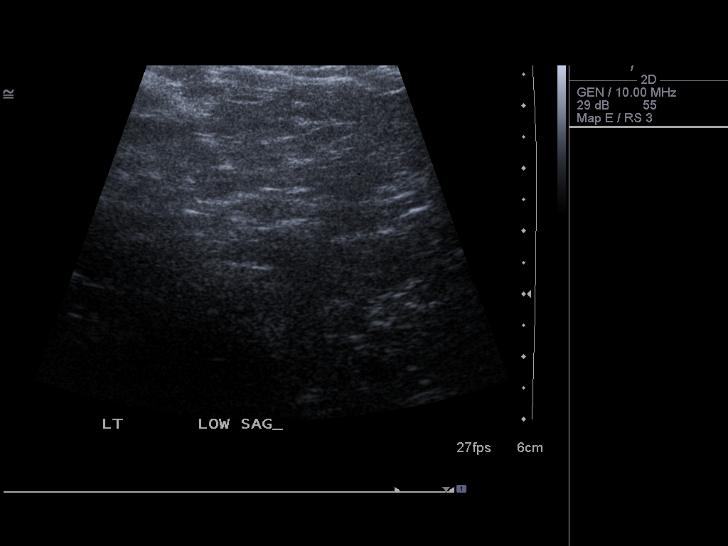
[im 56/56]
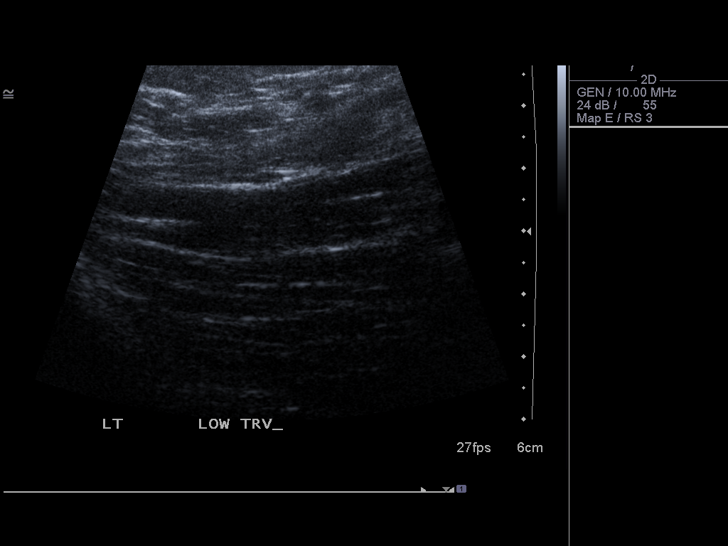

[14 of 25 positions shown; findings below may reference images not displayed]

FINDINGS: Right testis:  Normal in size and appearance, measuring 3.9 x 2.2 x
2.9 cm.

Left testis:  Normal in size and appearance, measuring 3.9 x 2.2 x
2.8 cm.

Right epididymis:  Normal in size and appearance, noting a 4 mm
epididymal head cyst..

Left epididymis:  Normal in size and appearance.

Hydrocele:  Absent.

Varicocele:  Absent.

Pulsed Doppler interrogation of both testes demonstrates low
resistance flow bilaterally.
IMPRESSION: Negative scrotal ultrasound.

No evidence of testicular torsion.

## 2017-08-16 ENCOUNTER — Emergency Department
Admission: EM | Admit: 2017-08-16 | Discharge: 2017-08-16 | Disposition: A | Discharge status: Home / Self Care | Payer: Self-pay | Attending: Emergency Medicine | Admitting: Emergency Medicine

## 2017-08-16 DIAGNOSIS — Z5189 Encounter for other specified aftercare: Secondary | ICD-10-CM | POA: Insufficient documentation

## 2017-08-16 MED ORDER — CEPHALEXIN 500 MG PO CAPS
500.00 mg | ORAL_CAPSULE | Freq: Four times a day (QID) | ORAL | 0 refills | Status: AC
Start: 2017-08-16 — End: 2017-08-23
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
## 2021-05-11 ENCOUNTER — Telehealth (HOSPITAL_BASED_OUTPATIENT_CLINIC_OR_DEPARTMENT_OTHER): Payer: Self-pay | Admitting: Physician Assistant
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
## 2021-05-17 ENCOUNTER — Ambulatory Visit (HOSPITAL_BASED_OUTPATIENT_CLINIC_OR_DEPARTMENT_OTHER): Payer: BLUE CROSS/BLUE SHIELD | Admitting: Physician Assistant

## 2021-05-18 ENCOUNTER — Ambulatory Visit (HOSPITAL_BASED_OUTPATIENT_CLINIC_OR_DEPARTMENT_OTHER): Payer: BLUE CROSS/BLUE SHIELD | Admitting: Physician Assistant

## 2021-05-18 ENCOUNTER — Ambulatory Visit
Admission: RE | Admit: 2021-05-18 | Discharge: 2021-05-18 | Disposition: A | Discharge status: Home / Self Care | Payer: BLUE CROSS/BLUE SHIELD | Source: Ambulatory Visit | Attending: Diagnostic Radiology | Admitting: Diagnostic Radiology

## 2021-05-18 VITALS — BP 180/108 | HR 67 | Temp 97.8°F | Ht 73.0 in | Wt 284.0 lb

## 2021-05-18 DIAGNOSIS — M25562 Pain in left knee: Secondary | ICD-10-CM

## 2021-05-18 DIAGNOSIS — S8392XA Sprain of unspecified site of left knee, initial encounter: Secondary | ICD-10-CM

## 2021-05-18 DIAGNOSIS — G8929 Other chronic pain: Secondary | ICD-10-CM

## 2021-05-18 NOTE — Patient Instructions (Signed)
[facility]  [address]  [place] [address]  P: [phone]    [facility]  [address]  [place] [address]   P: [phone]  F: [phone]    [facility]  [address]  [place] [address]  P: [phone]  F: [phone]    [facility]  [address]  [place] [address]  P: [phone]    [facility]  [address]  [place] [address]  P: [phone]  F: [phone]    [facility]  [address], [address]  [place] [address]  P: [phone]  F: [phone]    [facility]  [address]  [place] [address]  P: [phone]  F: [phone]    [facility] [place]   [address]  [place] [address]  P: [phone]

## 2021-05-18 NOTE — Progress Notes (Signed)
05/18/2021      INITIAL INJURY    Chief Complaint   Patient presents with   . New Patient     LEFT knee injury 05/09/21, medial pain       Chris Pennington is a 36 year old  male with a 1 week  history of gradually improving left knee pain that started after landing on his left leg after a kick during a martial arts exercise. The pain is located anterior knee, inferior to the patella, medially and laterally. He has particular difficulty with rotation, extension, flexion.    Pain scale: 2 out of 10  Mechanical symptoms: none  Treatments tried: activity modification, antiinflammatories, tylenol, massage therapy, ice and heat, compression  Related surgical history: none  Pertinent Family History: none    SOCIAL HISTORY  Current Tobacco Use: NO   Occupation: working as Geneticist, molecular, [employer], [employer]   Sport/activity: martial arts, running, weight lifting, level: recreational    No past medical history on file.  No past surgical history on file.  Current Outpatient Medications   Medication Sig Dispense Refill   . ibuprofen 200 MG tablet Take 1 tablet (200 mg) by mouth.     . Other Meds (See Sig/Instructions) Medication name: Beef organ supplements     . VITAMIN D OR Take by mouth.       No current facility-administered medications for this visit.       ROS is positive for musculoskeletal, as mentioned in the HPI.  All other systems reviewed and are negative or noncontributory.    _____________________    PHYSICAL EXAM    BP (!) 180/108   Pulse 67   Temp 36.6 C (Temporal)   Ht 6\' 1"  (1.854 m)   Wt (!) 128.8 kg (284 lb)   SpO2 97%   BMI 37.47 kg/m   His appearance is appropriate.      Chris Pennington is oriented to time, place, and person.  His affect is appropriate  he demonstrates no excessive respiratory effort. His gait is moderately antalgic. The overall limb alignment is neutral with weight bearing on the right, and neutral on the left.  Skin is intact. Neurovascularly intact  bilaterally.      KNEE EXAM: RIGHT    Effusion: No  PROM: 0-135  AROM: 0-135  Quad strength: 5/5  TTP: none noted  Patellar mobility: Normal  MCL: neg.   LCL: neg.         KNEE EXAM: LEFT      Effusion: No  PROM: 5-90  AROM: 5-120  Quad strength: 5/5  TTP: none noted  Patellar mobility: Normal  Patellar grind test: negative  McMurray: negative bilateral  Lachman: neg. however, limitations in exam due to body habitus  MCL: neg.   LCL: neg.   PCL: neg.           IMAGING:         XR Knee 4+ View Left    Result Date: 05/18/2021  EXAMINATION: XR KNEE 4+ VW LEFT CLINICAL INDICATION: left knee pain COMPARISON: No prior images are currently available for comparison. FINDINGS AND     Small joint effusion. No fracture or dislocation. Small spur is seen along the superior dorsal patellar surface, without joint space loss.       ASSESSMENT:    36 year old with left knee pain after stepping wrong during a martial arts exercise with limited range of motion, particularly flexion.  Exam was limited due to body habitus  and guarding, however lack of joint line tenderness is reassuring.  Patient brought with him a still image of an ultrasound performed by an athletic trainer, however I was unable to make a diagnosis based on this image.    (M25.562,  G89.29) Chronic pain of left knee  (primary encounter diagnosis)  (W43.15QM) Sprain of left knee, unspecified ligament, initial encounter              PLAN:    Start formal physical therapy, referral provided    If Josh fails conservative treatment I feel he be a good candidate for MRI to evaluate for meniscus injury.    Imaging and anatomy reviewed with patient.     Discussed diagnosis and recommendations. Patient expressed understanding.         Chris Ingles, PA-C  Teaching Associate   [facility] Department of Orthopaedics & Sports Medicine        I spent a total of 33 minutes for the patient's care on the date of the service.
